# Patient Record
Sex: Female | Born: 2000 | Race: Black or African American | Hispanic: No | Marital: Single | Smoking: Never smoker
Health system: Southern US, Community
[De-identification: ages and names within clinical notes are randomized; demographics above are authoritative.]

## PROBLEM LIST (undated history)

## (undated) DIAGNOSIS — R519 Headache, unspecified: Secondary | ICD-10-CM

## (undated) DIAGNOSIS — R51 Headache: Secondary | ICD-10-CM

## (undated) HISTORY — PX: NO PAST SURGERIES: SHX2092

---
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
## 2021-03-22 ENCOUNTER — Other Ambulatory Visit: Payer: Self-pay | Admitting: Physician Assistant

## 2021-03-22 DIAGNOSIS — N938 Other specified abnormal uterine and vaginal bleeding: Secondary | ICD-10-CM

## 2021-03-28 ENCOUNTER — Ambulatory Visit
Admission: RE | Admit: 2021-03-28 | Discharge: 2021-03-28 | Disposition: A | Discharge status: Home / Self Care | Payer: Medicaid Other | Source: Ambulatory Visit | Attending: Physician Assistant | Admitting: Physician Assistant

## 2021-03-28 ENCOUNTER — Other Ambulatory Visit: Payer: Self-pay

## 2021-03-28 DIAGNOSIS — N938 Other specified abnormal uterine and vaginal bleeding: Secondary | ICD-10-CM | POA: Diagnosis present
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
## 2022-02-10 ENCOUNTER — Ambulatory Visit
Admission: EM | Admit: 2022-02-10 | Discharge: 2022-02-10 | Disposition: A | Discharge status: Home / Self Care | Payer: Medicaid Other

## 2022-02-10 ENCOUNTER — Emergency Department (HOSPITAL_COMMUNITY)
Admission: EM | Admit: 2022-02-10 | Discharge: 2022-02-10 | Discharge status: Against Medical Advice | Payer: Medicaid Other | Attending: Emergency Medicine | Admitting: Emergency Medicine

## 2022-02-10 ENCOUNTER — Emergency Department (HOSPITAL_COMMUNITY): Payer: Medicaid Other

## 2022-02-10 ENCOUNTER — Encounter: Payer: Self-pay | Admitting: Emergency Medicine

## 2022-02-10 ENCOUNTER — Ambulatory Visit (INDEPENDENT_AMBULATORY_CARE_PROVIDER_SITE_OTHER): Payer: Medicaid Other

## 2022-02-10 ENCOUNTER — Encounter (HOSPITAL_COMMUNITY): Payer: Self-pay | Admitting: Emergency Medicine

## 2022-02-10 ENCOUNTER — Other Ambulatory Visit: Payer: Self-pay

## 2022-02-10 DIAGNOSIS — S60511A Abrasion of right hand, initial encounter: Secondary | ICD-10-CM

## 2022-02-10 DIAGNOSIS — T07XXXA Unspecified multiple injuries, initial encounter: Secondary | ICD-10-CM | POA: Diagnosis not present

## 2022-02-10 DIAGNOSIS — S60512A Abrasion of left hand, initial encounter: Secondary | ICD-10-CM

## 2022-02-10 DIAGNOSIS — S0083XA Contusion of other part of head, initial encounter: Secondary | ICD-10-CM

## 2022-02-10 DIAGNOSIS — S0990XA Unspecified injury of head, initial encounter: Secondary | ICD-10-CM

## 2022-02-10 DIAGNOSIS — M25562 Pain in left knee: Secondary | ICD-10-CM

## 2022-02-10 DIAGNOSIS — M79642 Pain in left hand: Secondary | ICD-10-CM

## 2022-02-10 DIAGNOSIS — S80212A Abrasion, left knee, initial encounter: Secondary | ICD-10-CM | POA: Diagnosis not present

## 2022-02-10 DIAGNOSIS — Z5321 Procedure and treatment not carried out due to patient leaving prior to being seen by health care provider: Secondary | ICD-10-CM | POA: Insufficient documentation

## 2022-02-10 DIAGNOSIS — M79641 Pain in right hand: Secondary | ICD-10-CM

## 2022-02-10 MED ORDER — HYDROCODONE-ACETAMINOPHEN 5-325 MG PO TABS: 1.0000 | ORAL_TABLET | Freq: Three times a day (TID) | ORAL | 0 refills | Status: AC | PRN
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
## 2022-02-10 NOTE — Discharge Instructions (Addendum)
-  I reviewed her CT scans which were negative for any facial bone fractures or major head injury findings. - We performed some x-rays of your hands and your left knee today.  No fractures noted. - You have multiple abrasions and contusions.  You should ice these areas and take ibuprofen for pain relief.  I sent something additionally for severe pain as needed for the next couple days. - Practice good wound care and clean your wounds every day with soap and water.  Apply Neosporin.  If you develop any signs of wound infection, please return for reevaluation. - For any worsening of your headache, go to ER.
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
## 2022-02-10 NOTE — ED Notes (Signed)
Pt stated that they are leaving due to wait times
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
## 2023-06-04 IMAGING — US US PELVIS COMPLETE WITH TRANSVAGINAL
1 series · 14 of 25 positions shown · non-contrast
Comparison: None

CLINICAL DATA: Dysfunctional uterine bleeding

EXAM:
TRANSABDOMINAL AND TRANSVAGINAL ULTRASOUND OF PELVIS
TECHNIQUE: Both transabdominal and transvaginal ultrasound examinations of the
pelvis were performed. Transabdominal technique was performed for
global imaging of the pelvis including uterus, ovaries, adnexal
regions, and pelvic cul-de-sac. It was necessary to proceed with
endovaginal exam following the transabdominal exam to visualize the
uterus endometrium adnexa.

[Series 1: us pelvis complete with transvaginal · 0.20mm/px · 59 acquisitions, 14 frames shown]
[im 1/59]
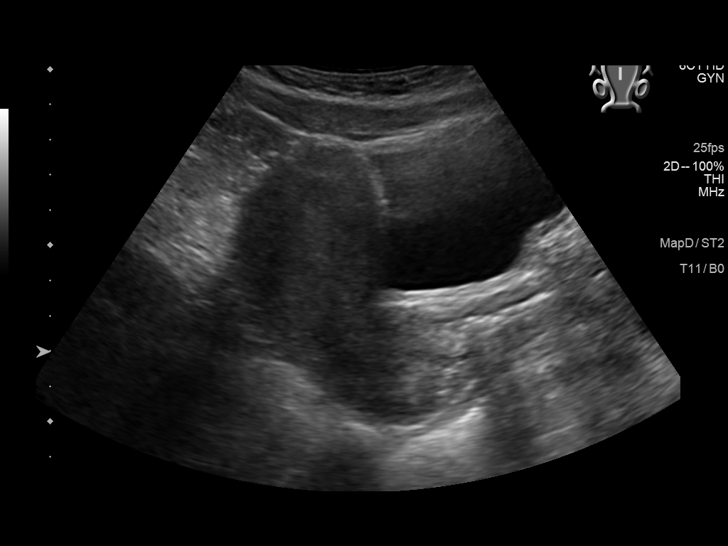
[im 5/59]
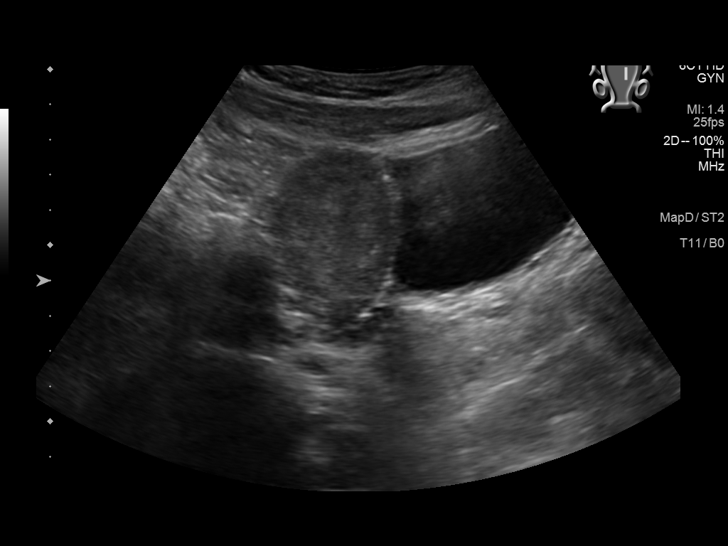
[im 10/59]
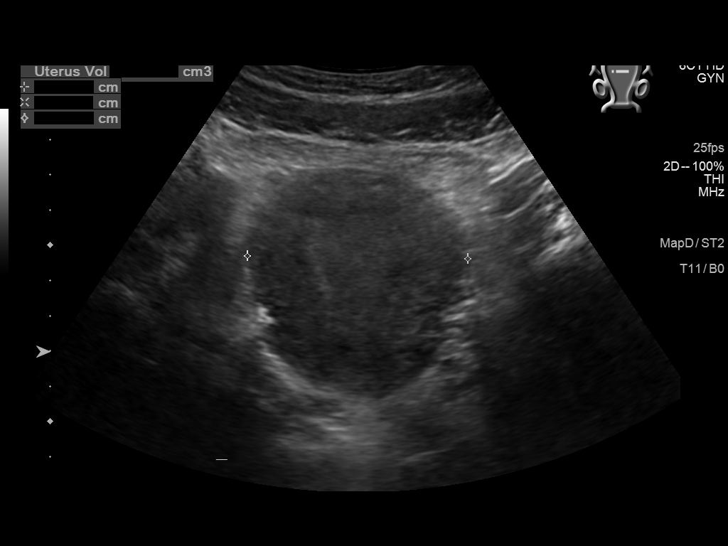
[im 15/59]
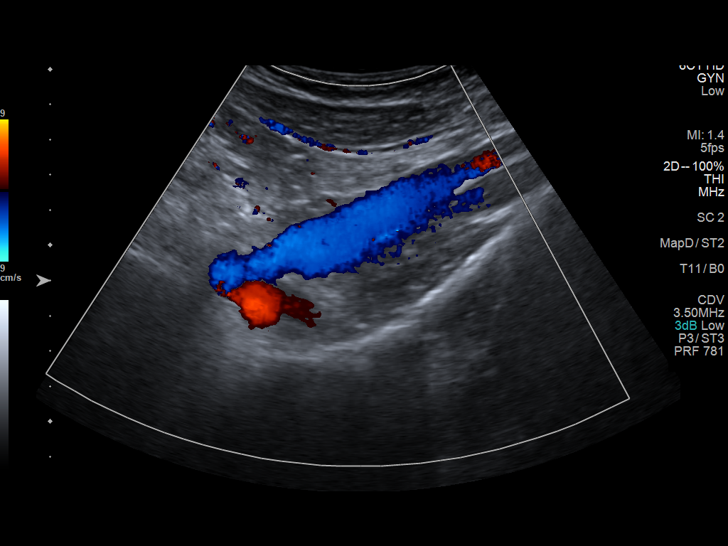
[im 20/59]
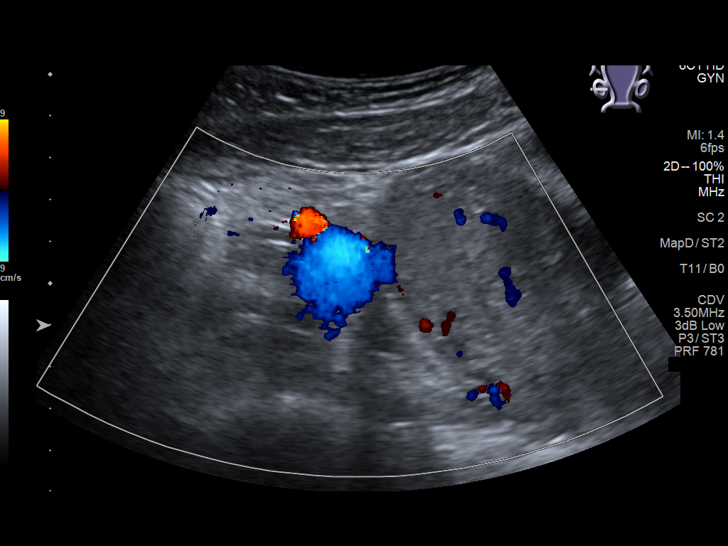
[im 22/59]
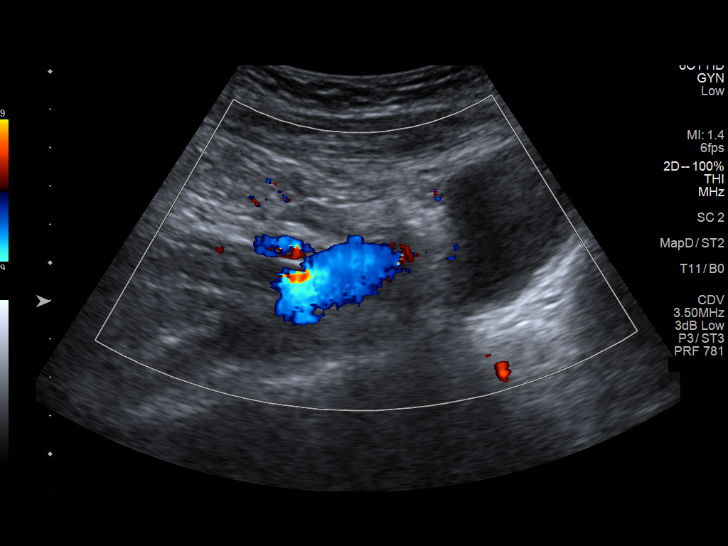
[im 27/59]
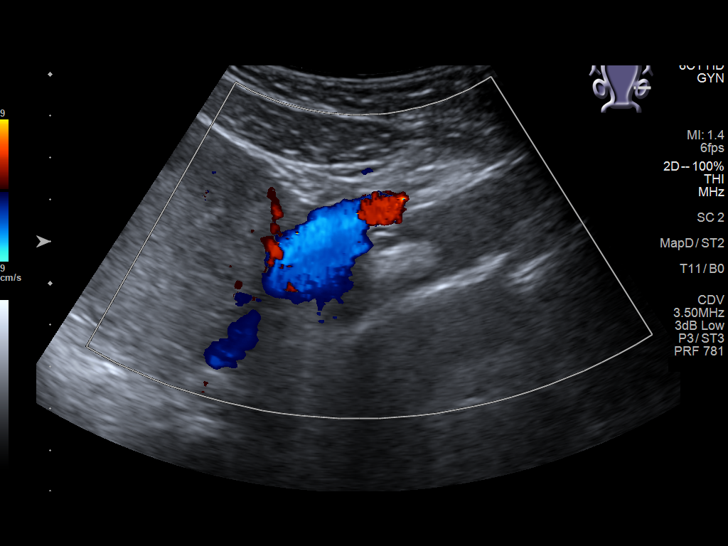
[im 32/59]
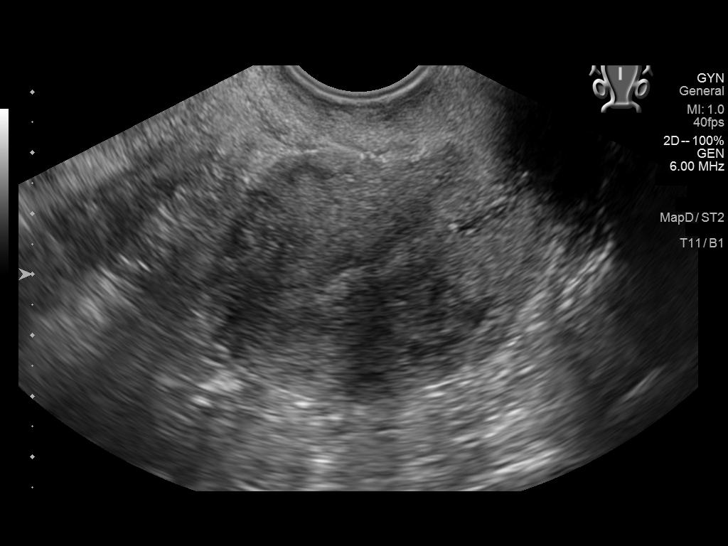
[im 37/59]
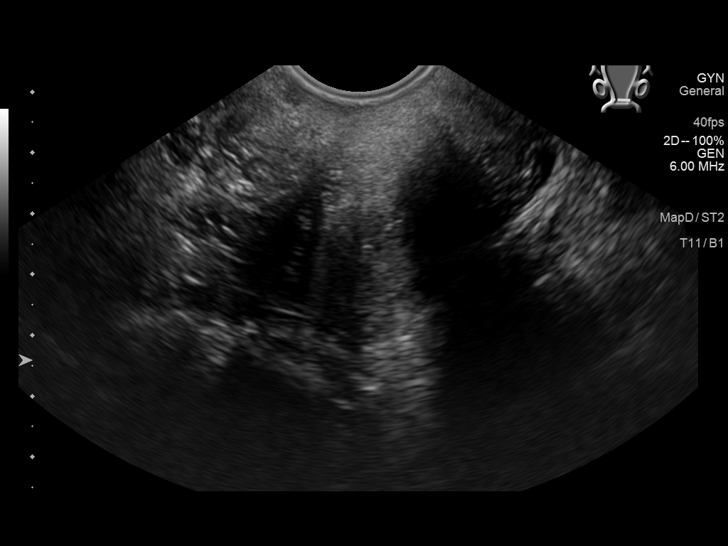
[im 39/59]
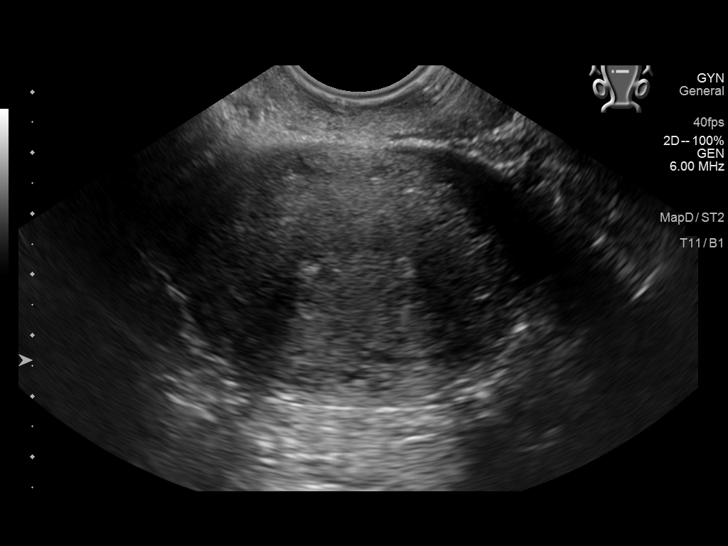
[im 44/59]
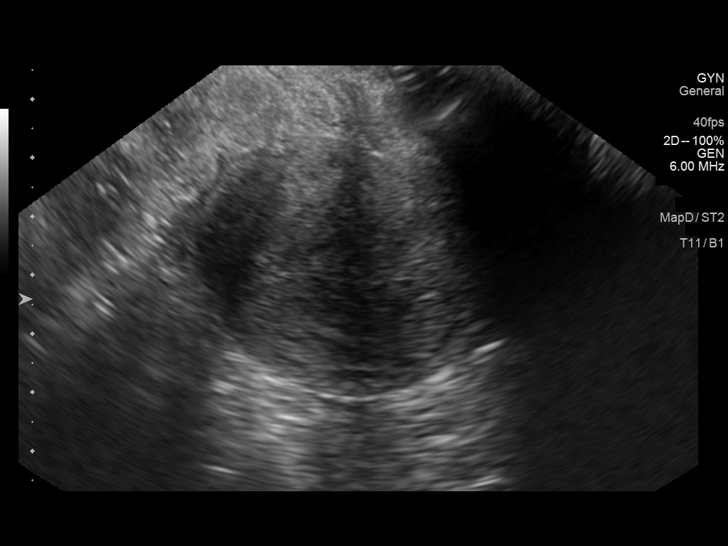
[im 49/59]
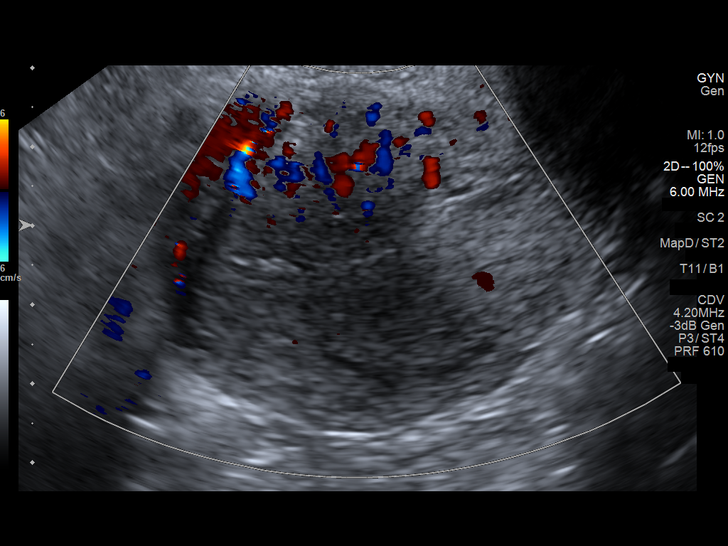
[im 54/59]
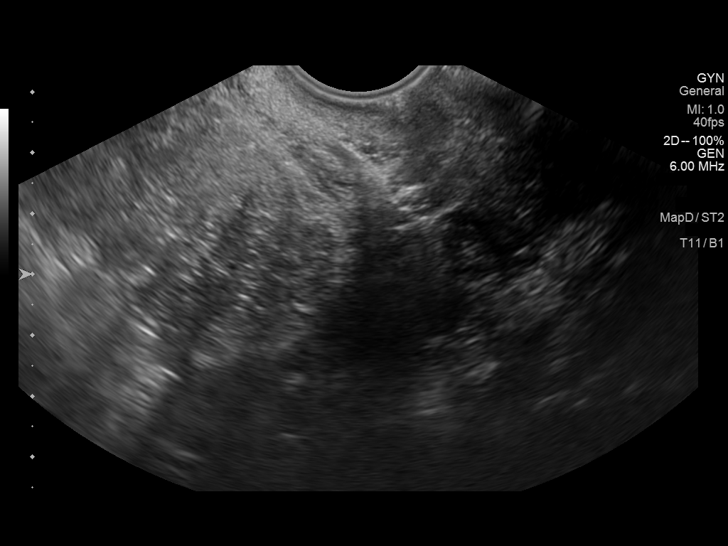
[im 59/59]
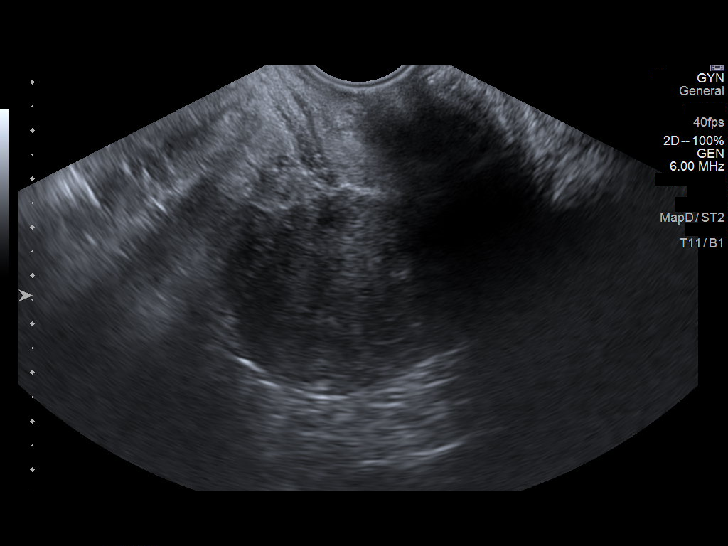

[14 of 25 positions shown; findings below may reference images not displayed]

FINDINGS: Uterus

Measurements: 7.3 x 4.6 x 6 cm = volume: 104 mL. No fibroids or
other mass visualized.

Endometrium

Thickness: 8.8 mm.  No focal abnormality visualized.

Right ovary

Not seen

Left ovary

Not seen

Other findings

No abnormal free fluid.
IMPRESSION: 1. Endometrial thickness of 8.8 mm. If bleeding remains unresponsive
to hormonal or medical therapy, sonohysterogram should be considered
for focal lesion work-up. (Ref: Radiological Reasoning: Algorithmic
Workup of Abnormal Vaginal Bleeding with Endovaginal Sonography and
Sonohysterography. AJR [id number]; 191:S68-73)
2. Nonvisualized ovaries.
